# Patient Record
Sex: Male | Born: 1965 | Race: White | Hispanic: No | Marital: Single
Health system: Southern US, Community
[De-identification: ages and names within clinical notes are randomized; demographics above are authoritative.]

---

## 2004-08-14 ENCOUNTER — Emergency Department: Payer: Self-pay | Admitting: Emergency Medicine

## 2005-05-02 ENCOUNTER — Emergency Department: Payer: Self-pay | Admitting: Emergency Medicine

## 2006-11-27 ENCOUNTER — Ambulatory Visit: Payer: Self-pay

## 2007-05-19 ENCOUNTER — Inpatient Hospital Stay: Payer: Self-pay | Admitting: Internal Medicine

## 2007-05-19 ENCOUNTER — Other Ambulatory Visit: Payer: Self-pay

## 2007-05-20 ENCOUNTER — Other Ambulatory Visit: Payer: Self-pay

## 2007-05-21 ENCOUNTER — Inpatient Hospital Stay: Payer: Self-pay | Admitting: Psychiatry

## 2007-05-21 ENCOUNTER — Other Ambulatory Visit: Payer: Self-pay

## 2008-10-09 ENCOUNTER — Emergency Department: Payer: Self-pay | Admitting: Emergency Medicine

## 2010-10-27 IMAGING — CT CT ABD-PELV W/ CM
1 of 2 series · 15 of 32 positions shown, 19 images · non-contrast
Comparison: none

REASON FOR EXAM: (1) abdominal pain; (2) abd pain
COMMENTS:   LMP: male

[Series 2: appendicitis · axial · 0.67mm/px · z∈[+22,+457]mm · 15 of 159 slices shown, 19 images]
[im 7/159  soft-tissue]
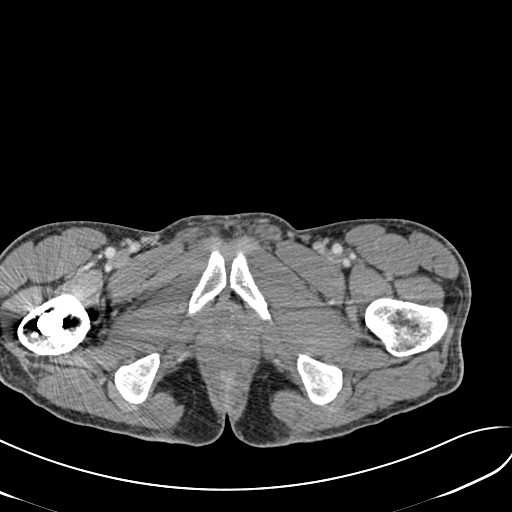
[im 7/159  bone]
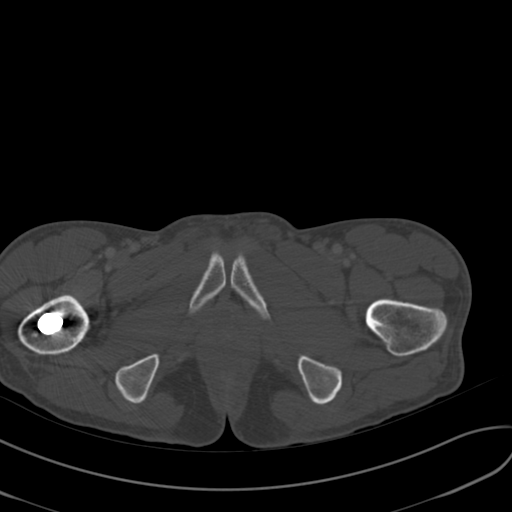
[im 19/159  soft-tissue]
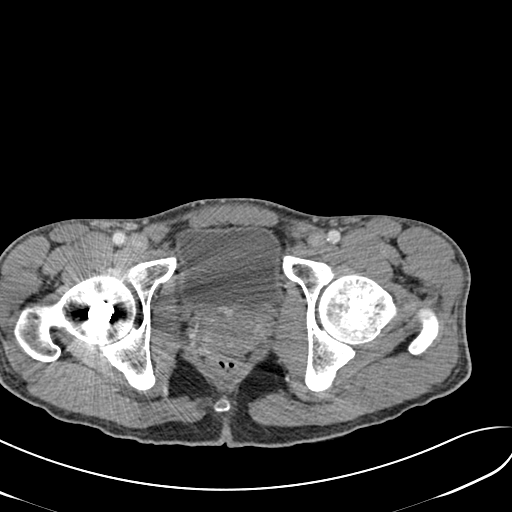
[im 32/159  soft-tissue]
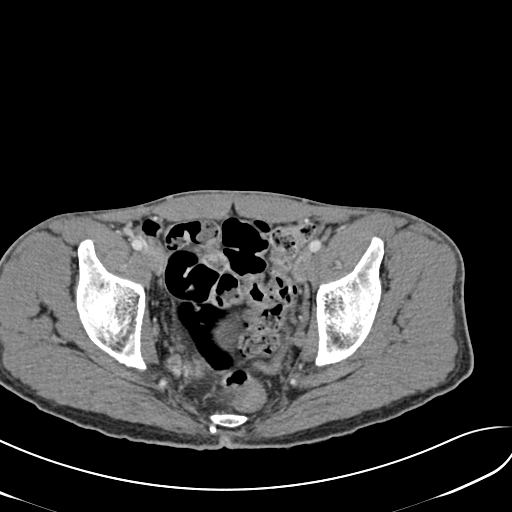
[im 45/159  soft-tissue]
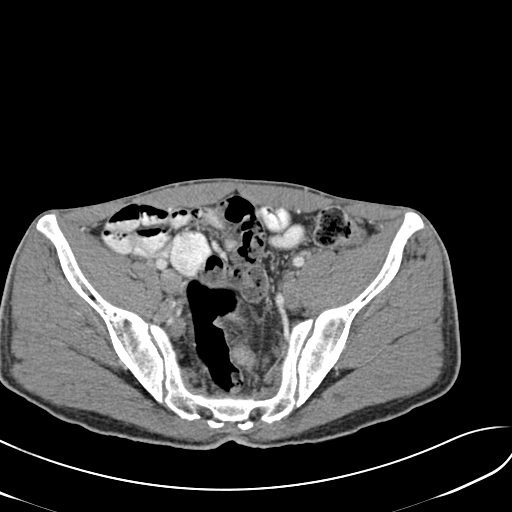
[im 57/159  soft-tissue]
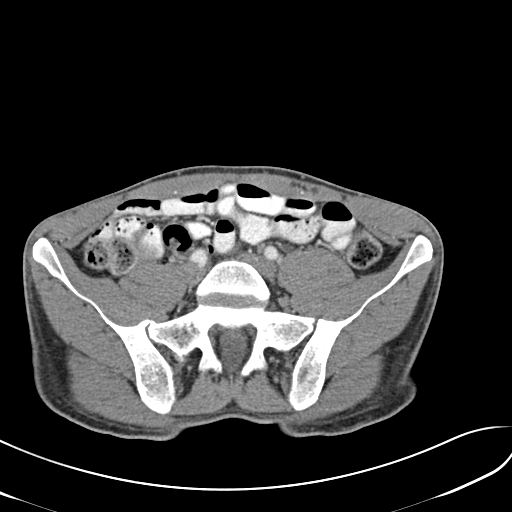
[im 70/159  soft-tissue]
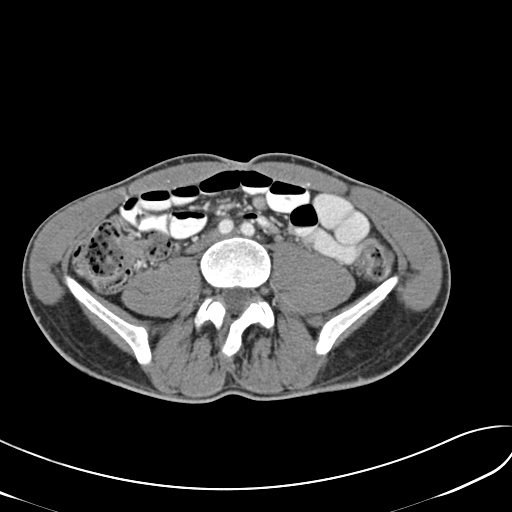
[im 83/159  soft-tissue]
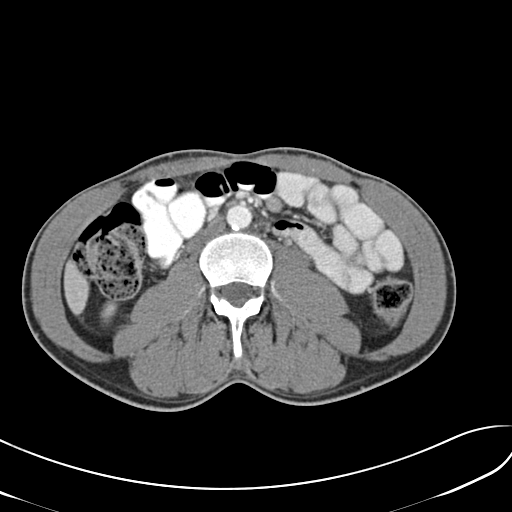
[im 89/159  soft-tissue]
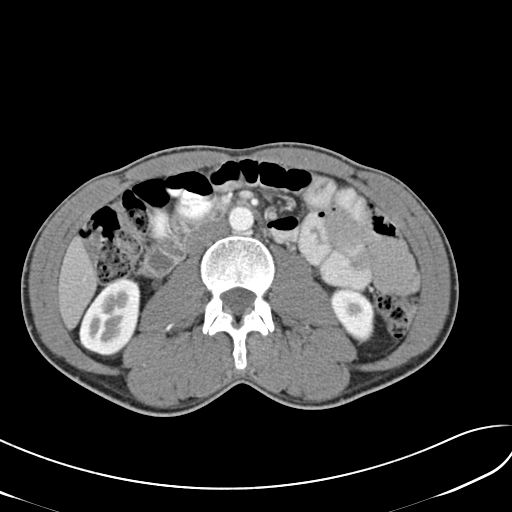
[im 102/159  soft-tissue]
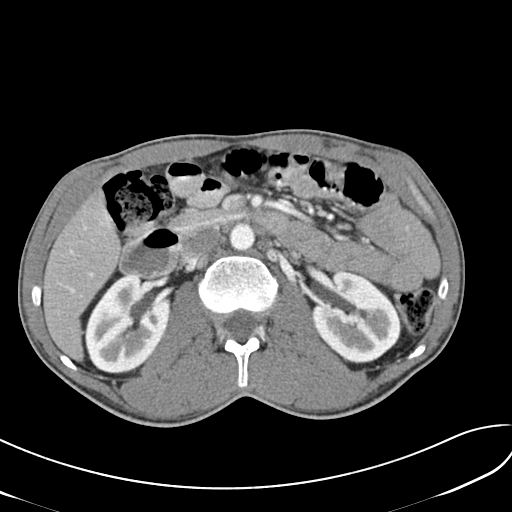
[im 102/159  bone]
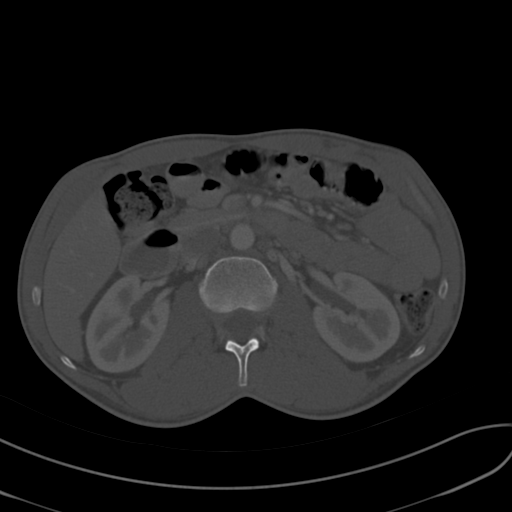
[im 114/159  soft-tissue]
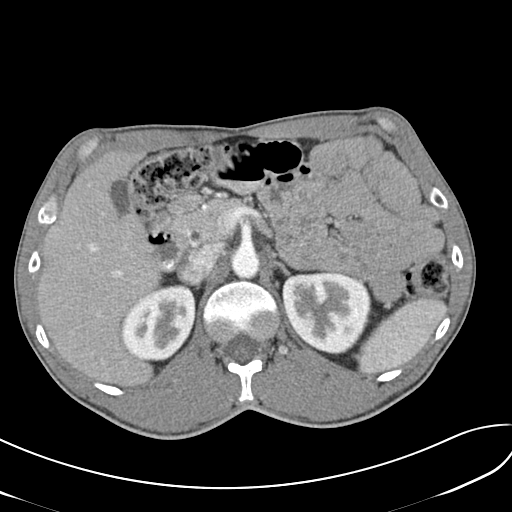
[im 127/159  soft-tissue]
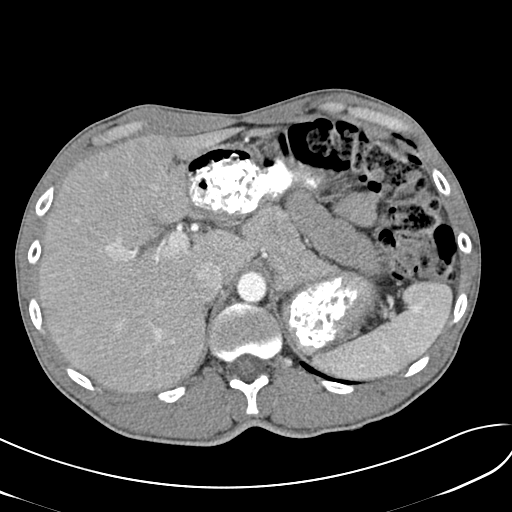
[im 133/159  lung]
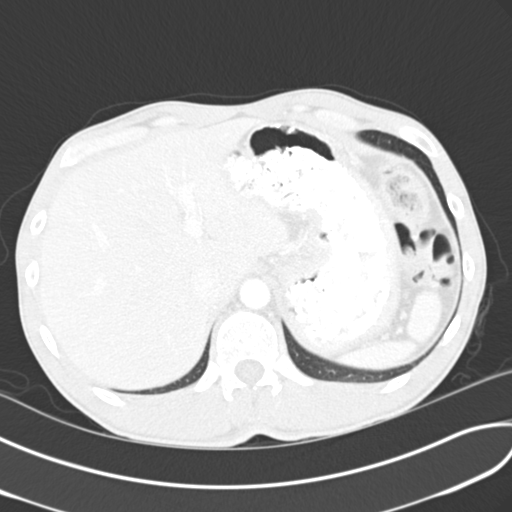
[im 140/159  soft-tissue]
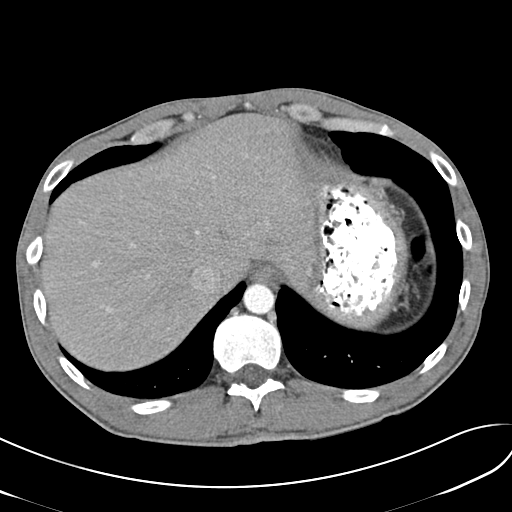
[im 140/159  lung]
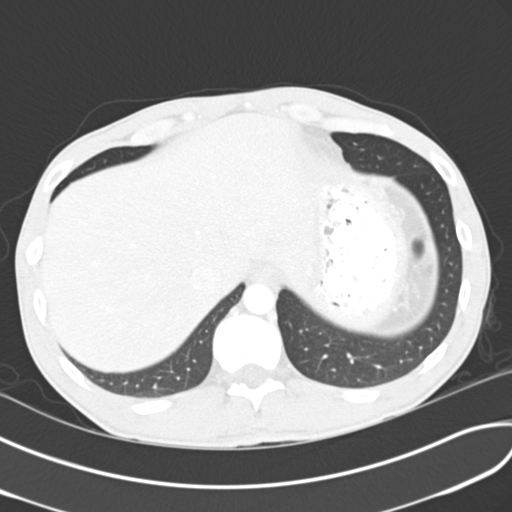
[im 146/159  lung]
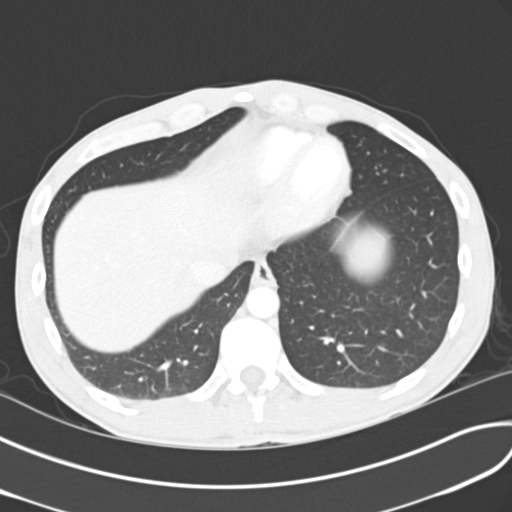
[im 152/159  soft-tissue]
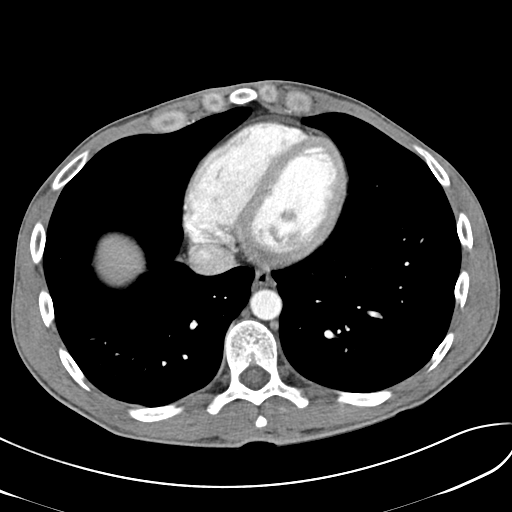
[im 152/159  lung]
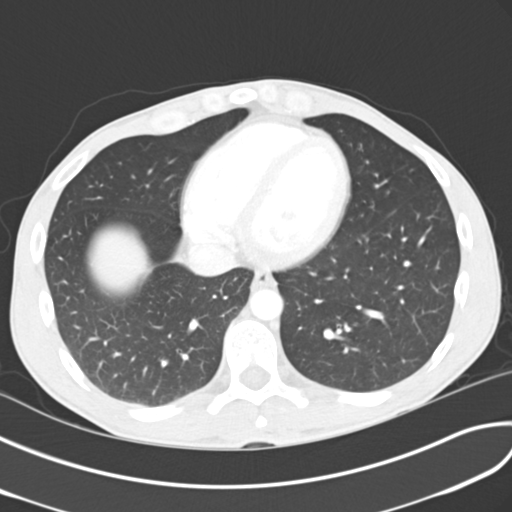

[15 of 32 positions shown; findings below may reference images not displayed]

PROCEDURE:     CT  - CT ABDOMEN / PELVIS  W  - October 10, 2008  [DATE]

RESULT:     CT of the abdomen and pelvis is performed utilizing 100 ml of
Zsovue-3XR iodinated intravenous contrast along with oral contrast. Images
are reconstructed in the axial plane at 3 mm slice thickness. The patient
has no previous examination for comparison.

The lung bases are clear and demonstrate no infiltrate or nodule. There is
no pneumothorax.

The kidneys enhance homogeneously. The liver, spleen, pancreas, gallbladder,
adrenal glands and aorta appear to be within normal limits. There is no
abnormal bowel distention or bowel wall thickening. There is no ascites or
free air. There is a screw through the femoral neck into the femoral head on
the right causing some artifact over the pelvis.
IMPRESSION: No acute abnormality. The colon is not opacified. The small
bowel is poorly opacified. No gallstones or urinary obstruction are
demonstrated.

## 2020-05-19 ENCOUNTER — Other Ambulatory Visit: Payer: Self-pay

## 2020-05-19 NOTE — Patient Outreach (Signed)
Care Coordination  05/19/2020  Jason Le 01-19-1966 [id number]   Medicaid Managed Care   Unsuccessful Outreach Note  05/19/2020 Name: Jason Le MRN: [id number] DOB: [birth year]  Referred by: No primary care provider on file. Reason for referral : High Risk Managed Medicaid (MM Screen)   An unsuccessful telephone outreach was attempted today. The patient was referred to the case management team for assistance with care management and care coordination.   Follow Up Plan: The care management team will reach out to the patient again over the next 7 days.   Gus Puma, BSW, [facility]  [place]  High Risk Managed Medicaid Team

## 2020-05-19 NOTE — Patient Outreach (Signed)
  Medicaid Managed Care Social Work Note  05/19/2020 Name:  Jason Le MRN:  [id number] DOB:  Aug 02, 1965  Jason Le is an 55 y.o. year old male who is a primary patient of No primary care provider on file..  The Medicaid Managed Care Coordination team was consulted for assistance with:  interpersonal saftety  Jason Le was given information about Medicaid Managed CareCoordination services today. Jason Le agreed to services and verbal consent obtained.  Engaged with patient  for by telephone forinitial visit in response to referral for case management and/or care coordination services.   Assessments/Interventions:  Review of past medical history, allergies, medications, health status, including review of consultants reports, laboratory and other test data, was performed as part of comprehensive evaluation and provision of chronic care management services.  SDOH: (Social Determinant of Health) assessments and interventions performed:   BSW completed MM Screen with patient. Patient states he does not remember completing anything with [facility]. Patient declined services and stated he does not need any resources at this time.  Advanced Directives Status:  Not addressed in this encounter.  Care Plan                 Not on File  Medications Reviewed Today   Medications were not reviewed prior to this encounter     There are no problems to display for this patient.   Conditions to be addressed/monitored per PCP order:  interpersonal safety  There are no care plans that you recently modified to display for this patient.   Follow up:  Patient requests no follow-up at this time.  Plan: The patient has been provided with contact information for the Managed Medicaid care management team and has been advised to call with any health related questions or concerns.    Gus Puma, BSW, [facility]  [place]  High Risk Managed Medicaid  Team

## 2020-05-19 NOTE — Patient Instructions (Addendum)
Visit Information  Jason Le was given information about Medicaid Managed Care team care coordination services as a part of their [facility] Medicaid benefit. Jason Le did not consent to engagement with the [facility] Managed Care team.   For questions related to your [facility] health plan, please call: [phone]  If you would like to schedule transportation through your [facility] plan, please call the following number at least 2 days in advance of your appointment: [phone]   Call the [facility] Crisis Line at [phone], at any time, 24 hours a day, 7 days a week. If you are in danger or need immediate medical attention call 911.  Jason Le - following are the goals we discussed in your visit today:  Goals Addressed   None       The patient has been provided with contact information for the Managed Medicaid care management team and has been advised to call with any health related questions or concerns.   Jason Le, BSW, [facility]  [place]  High Risk Managed Medicaid Team    Following is a copy of your plan of care:  There are no care plans to display for this patient.

## 2020-06-01 ENCOUNTER — Ambulatory Visit: Payer: Medicaid Other
